# Patient Record
Sex: Female | Born: 1980 | Race: White | Hispanic: No | Marital: Married | Smoking: Former smoker
Health system: Southern US, Community
[De-identification: ages and names within clinical notes are randomized; demographics above are authoritative.]

## PROBLEM LIST (undated history)

## (undated) ENCOUNTER — Inpatient Hospital Stay (HOSPITAL_COMMUNITY): Payer: Self-pay

## (undated) DIAGNOSIS — K649 Unspecified hemorrhoids: Secondary | ICD-10-CM

## (undated) DIAGNOSIS — K6289 Other specified diseases of anus and rectum: Secondary | ICD-10-CM

## (undated) DIAGNOSIS — N816 Rectocele: Secondary | ICD-10-CM

## (undated) HISTORY — DX: Other specified diseases of anus and rectum: K62.89

---

## 2005-10-01 HISTORY — PX: BREAST SURGERY: SHX581

## 2006-05-27 ENCOUNTER — Other Ambulatory Visit: Admission: RE | Admit: 2006-05-27 | Discharge: 2006-05-27 | Payer: Self-pay | Admitting: Obstetrics and Gynecology

## 2007-05-15 ENCOUNTER — Encounter: Admission: RE | Admit: 2007-05-15 | Discharge: 2007-05-15 | Payer: Self-pay | Admitting: Family Medicine

## 2007-05-27 ENCOUNTER — Other Ambulatory Visit: Admission: RE | Admit: 2007-05-27 | Discharge: 2007-05-27 | Payer: Self-pay | Admitting: Obstetrics and Gynecology

## 2007-12-31 ENCOUNTER — Ambulatory Visit (HOSPITAL_COMMUNITY): Admission: RE | Admit: 2007-12-31 | Discharge: 2007-12-31 | Payer: Self-pay | Admitting: General Surgery

## 2008-01-30 HISTORY — PX: CHOLECYSTECTOMY: SHX55

## 2008-02-19 ENCOUNTER — Encounter (INDEPENDENT_AMBULATORY_CARE_PROVIDER_SITE_OTHER): Payer: Self-pay | Admitting: General Surgery

## 2008-02-19 ENCOUNTER — Ambulatory Visit (HOSPITAL_COMMUNITY): Admission: RE | Admit: 2008-02-19 | Discharge: 2008-02-19 | Payer: Self-pay | Admitting: General Surgery

## 2009-03-19 ENCOUNTER — Inpatient Hospital Stay (HOSPITAL_COMMUNITY): Admission: AD | Admit: 2009-03-19 | Discharge: 2009-03-22 | Payer: Self-pay | Admitting: Obstetrics and Gynecology

## 2009-03-20 ENCOUNTER — Encounter (INDEPENDENT_AMBULATORY_CARE_PROVIDER_SITE_OTHER): Payer: Self-pay | Admitting: Obstetrics and Gynecology

## 2009-12-25 IMAGING — NM NM HEPATO W/GB/PHARM/[PERSON_NAME]
3 series · 18 of 18 positions shown · non-contrast
Comparison: none

CLINICAL DATA: Abdominal pain

[hida · 2.40mm/px · 6 of 12 frames shown (1 of 3)]
[frame 2/12]
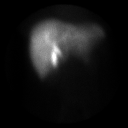
[frame 4/12]
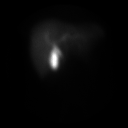
[frame 6/12]
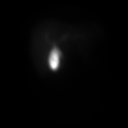
[frame 8/12]
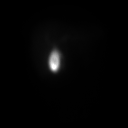
[frame 10/12]
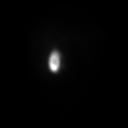
[frame 12/12]
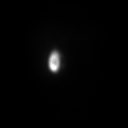

[hida · 2.40mm/px · 6 of 60 frames shown (2 of 3)]
[frame 6/60]
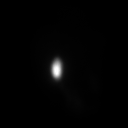
[frame 16/60]
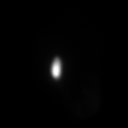
[frame 26/60]
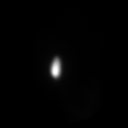
[frame 36/60]
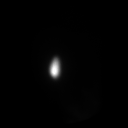
[frame 46/60]
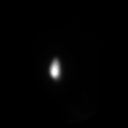
[frame 56/60]
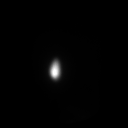

[hida · 2.40mm/px · 6 of 60 frames shown (3 of 3)]
[frame 6/60]
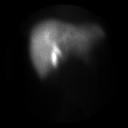
[frame 16/60]
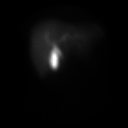
[frame 26/60]
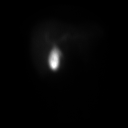
[frame 36/60]
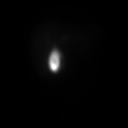
[frame 46/60]
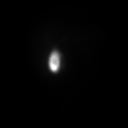
[frame 56/60]
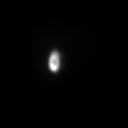

[18 of 18 positions shown; findings below may reference images not displayed]

HEPATOBILIARY SCINTIGRAPHY WITH EJECTION FRACTION]    :

Anterior imaging after J.DmMi McVV9 Choletec IV. There is prompt
clearance of the radiopharmaceutical from the blood pool. Timely
visualization of activity in central bile ducts, small bowel, and
gallbladder.
After 1 hour, the patient ingested 8 ounces half-and-half orally.
The calculated gallbladder ejection fraction after 60 minutes is 1%
(normally greater than 50%).

IMPRESSION
1. Patency of cystic and common bile ducts.
2. Significantly decreased gallbladder ejection fraction.

## 2010-10-18 ENCOUNTER — Inpatient Hospital Stay (HOSPITAL_COMMUNITY)
Admission: AD | Admit: 2010-10-18 | Discharge: 2010-10-18 | Payer: Self-pay | Source: Home / Self Care | Attending: Obstetrics and Gynecology | Admitting: Obstetrics and Gynecology

## 2010-10-22 ENCOUNTER — Encounter: Payer: Self-pay | Admitting: General Surgery

## 2010-10-23 LAB — URINALYSIS, ROUTINE W REFLEX MICROSCOPIC
Bilirubin Urine: NEGATIVE
Hgb urine dipstick: NEGATIVE
Ketones, ur: NEGATIVE mg/dL
Protein, ur: NEGATIVE mg/dL
Urine Glucose, Fasting: NEGATIVE mg/dL

## 2010-10-23 LAB — GC/CHLAMYDIA PROBE AMP, GENITAL
Chlamydia, DNA Probe: NEGATIVE
GC Probe Amp, Genital: NEGATIVE

## 2010-10-23 LAB — WET PREP, GENITAL

## 2010-12-29 ENCOUNTER — Inpatient Hospital Stay (HOSPITAL_COMMUNITY)
Admission: RE | Admit: 2010-12-29 | Discharge: 2010-12-30 | DRG: 775 | Disposition: A | Discharge status: Home / Self Care | Payer: PRIVATE HEALTH INSURANCE | Source: Ambulatory Visit | Attending: Obstetrics and Gynecology | Admitting: Obstetrics and Gynecology

## 2010-12-29 DIAGNOSIS — O99892 Other specified diseases and conditions complicating childbirth: Secondary | ICD-10-CM | POA: Diagnosis present

## 2010-12-29 DIAGNOSIS — Z22330 Carrier of Group B streptococcus: Secondary | ICD-10-CM

## 2010-12-29 LAB — CBC
MCH: 31.2 pg (ref 26.0–34.0)
Platelets: 209 10*3/uL (ref 150–400)
RBC: 3.94 MIL/uL (ref 3.87–5.11)
WBC: 8.4 10*3/uL (ref 4.0–10.5)

## 2010-12-29 LAB — RPR: RPR Ser Ql: NONREACTIVE

## 2010-12-30 LAB — CBC
Hemoglobin: 11.6 g/dL — ABNORMAL LOW (ref 12.0–15.0)
MCHC: 32.7 g/dL (ref 30.0–36.0)
RBC: 3.82 MIL/uL — ABNORMAL LOW (ref 3.87–5.11)
WBC: 14.1 10*3/uL — ABNORMAL HIGH (ref 4.0–10.5)

## 2011-01-01 ENCOUNTER — Encounter (HOSPITAL_COMMUNITY)
Admission: RE | Admit: 2011-01-01 | Discharge: 2011-01-01 | Disposition: A | Discharge status: Home / Self Care | Payer: PRIVATE HEALTH INSURANCE | Source: Ambulatory Visit | Attending: Obstetrics and Gynecology | Admitting: Obstetrics and Gynecology

## 2011-01-01 DIAGNOSIS — O923 Agalactia: Secondary | ICD-10-CM | POA: Insufficient documentation

## 2011-01-01 LAB — RH IMMUNE GLOB WKUP(>/=20WKS)(NOT WOMEN'S HOSP)
Antibody Screen: NEGATIVE
Fetal Screen: NEGATIVE
Unit division: 0

## 2011-01-08 LAB — CBC
HCT: 23.7 % — ABNORMAL LOW (ref 36.0–46.0)
HCT: 24.1 % — ABNORMAL LOW (ref 36.0–46.0)
Hemoglobin: 11.8 g/dL — ABNORMAL LOW (ref 12.0–15.0)
MCHC: 34.5 g/dL (ref 30.0–36.0)
MCV: 94 fL (ref 78.0–100.0)
MCV: 95.1 fL (ref 78.0–100.0)
MCV: 95.3 fL (ref 78.0–100.0)
Platelets: 173 10*3/uL (ref 150–400)
RBC: 2.54 MIL/uL — ABNORMAL LOW (ref 3.87–5.11)
RBC: 3.6 MIL/uL — ABNORMAL LOW (ref 3.87–5.11)
RDW: 13 % (ref 11.5–15.5)
WBC: 10.4 10*3/uL (ref 4.0–10.5)

## 2011-01-08 LAB — RPR: RPR Ser Ql: NONREACTIVE

## 2011-02-13 NOTE — Op Note (Signed)
[name], [name]             ACCOUNT NO.:  [id number]   MEDICAL RECORD NO.:  [id number]          PATIENT TYPE:  AMB   LOCATION:  SDS                          FACILITY:  [facility]   PHYSICIAN:  Ollen Gross. Vernell Morgans, M.D. DATE OF BIRTH:  Feb 02, 1981   DATE OF PROCEDURE:  02/19/2008  DATE OF DISCHARGE:  02/19/2008                               OPERATIVE REPORT   PREOPERATIVE DIAGNOSIS:  Biliary dyskinesia.   POSTOPERATIVE DIAGNOSIS:  Biliary dyskinesia.   PROCEDURE:  Laparoscopic cholecystectomy with intraoperative  cholangiogram.   SURGEON:  Ollen Gross. Vernell Morgans, MD   ASSISTANT:  Sandria Bales. Ezzard Standing, MD   ANESTHESIA:  General endotracheal.   PROCEDURE:  After informed consent was obtained, the patient was brought  to the operating room and placed in the supine position on the operating  room table.  After adequate induction of general anesthesia, the  patient's abdomen was prepped with Betadine and draped in usual sterile  manner.  The area above the umbilicus was infiltrated with 0.25%  Marcaine.  The patient requested that we excise her old belly ring scar.  This little area of scar was then excised sharply with an 11 blade  knife.  Dissection was carried to the subcutaneous tissue bluntly with a  hemostat and [employer] retractors until the linea alba was identified.  The linea alba was incised with a 15 blade knife and each side was  grasped with Kocher clamps and elevated anteriorly.  The preperitoneal  space was then probed bluntly with a hemostat until the peritoneum was  opened and access was gained to the abdominal cavity.  A 0 Vicryl  pursestring stitch was placed in the fascia around the opening.  A  Hasson cannula was placed through the opening and anchored in place to  previously placed 0 Vicryl pursestring stitch.  The abdomen was then  insufflated with carbon dioxide without difficulty.  The patient was  placed in reverse Trendelenburg position and rotated with the right  side  up.  A laparoscope was inserted through the Hasson cannula and the right  upper quadrant was inspected.  The dome of the gallbladder and liver  were readily identified.  Next, the epigastric region was then  infiltrated with 0.25% Marcaine.  A small incision was made with a 15  blade knife.  A 10-mm port was then placed bluntly through this incision  into the abdominal cavity under direct vision.  Sites were then chosen  laterally on the right side of the abdomen with placement of 5-mm ports.  Each of these areas were infiltrated with 0.25% Marcaine.  Small stab  incisions were made with a 15 blade knife.  The 5-mm ports were then  placed bluntly through these incisions into the abdominal cavity under  direct vision.  Blunt grasper was placed through the lateral-most 5-mm  port and used to grasp the dome of the gallbladder and elevated  anteriorly and superiorly.  Another blunt grasper was placed through the  other 5-mm port and used to retract on the body and neck of the  gallbladder.  A dissector was placed through  the epigastric port and  initially there were some filmy adhesions to the neck area of the  gallbladder.  These were taken down sharply with the laparoscopic  scissors.  Next, the dissector was placed through the epigastric port  using the electrocautery.  The peritoneal reflection at the gallbladder  neck area was opened.  Blunt dissection was then carried out in this  area until the gallbladder neck cystic duct junction was readily  identified and a good window was created.  A single clip was placed on  the gallbladder neck.  A small ductotomy was made just below the clip  with laparoscopic scissors.  A 14-gauge Angiocath was placed  percutaneously through the anterior abdominal wall under direct vision.  A Reddick cholangiogram catheter was placed through the Angiocath and  flushed.  The Reddick catheter was then placed within the cystic duct  and anchored in place  with a clip.  A cholangiogram was obtained that  showed no filling defects, good emptying in the duodenum and good  adequate length on the cystic duct.  The anchoring clip and catheters  were removed from the patient.  Three clips were placed proximally in  the cystic duct and the duct was divided between the two sets of clips.  Posterior to this, the cystic artery was identified and again dissected  bluntly in a circumferential manner until a good window was created.  Two clips were placed proximally and one distally in the artery and the  artery was divided between the two.  Next, a laparoscopic hook cautery  device was used to separate the gallbladder from the liver bed.  Prior  to completely detaching the gallbladder from the liver bed, the liver  bed was inspected and several small bleeding points were coagulated with  electrocautery until the area was completely hemostatic.  Gallbladder  was then detached the rest of the way from the liver bed without  difficulty with the cautery.  A laparoscopic bag was inserted through  the epigastric port.  The gallbladder was placed in the bag and the bag  was sealed.  Abdomen was then irrigated with copious amounts of saline  until the effluent was clear.  The laparoscope was then moved to the  epigastric port and a gallbladder grasper was placed through the Hasson  cannula and used to grasp the opening of the bag.  The bag with the  gallbladder was removed through the supraumbilical port without  difficulty.  The fascial defect was closed with the previously placed 0  Vicryl pursestring stitch, as well as with another figure-of-eight 0  Vicryl stitch.  The rest of the ports were removed under direct vision  and were found to be hemostatic.  The gas was allowed to escape.  The  fascia of the epigastric port was also closed with interrupted 0 Vicryl  stitch and the skin incisions were all closed with interrupted 4-0  Monocryl subcuticular  stitches and Dermabond dressings were applied.  The patient tolerated the procedure well.  At the end of the case, all  needle, sponge and instrument counts were correct.  The patient was then  awakened and taken to the recovery room in stable condition.      Ollen Gross. Vernell Morgans, M.D.  Electronically Signed     PST/MEDQ  D:  02/19/2008  T:  02/19/2008  Job:  [id number]

## 2011-06-27 LAB — COMPREHENSIVE METABOLIC PANEL
Albumin: 4.5
BUN: 15
Creatinine, Ser: 1.15
Total Bilirubin: 1.2
Total Protein: 6.9

## 2011-06-27 LAB — DIFFERENTIAL
Basophils Absolute: 0
Lymphocytes Relative: 40
Monocytes Absolute: 0.3
Monocytes Relative: 9
Neutro Abs: 2

## 2011-06-27 LAB — CBC
HCT: 41.8
MCV: 95
Platelets: 249
RDW: 12.5

## 2011-10-10 ENCOUNTER — Ambulatory Visit (INDEPENDENT_AMBULATORY_CARE_PROVIDER_SITE_OTHER): Payer: Self-pay | Admitting: General Surgery

## 2011-10-11 ENCOUNTER — Encounter (INDEPENDENT_AMBULATORY_CARE_PROVIDER_SITE_OTHER): Payer: Self-pay | Admitting: General Surgery

## 2011-10-12 ENCOUNTER — Encounter (INDEPENDENT_AMBULATORY_CARE_PROVIDER_SITE_OTHER): Payer: Self-pay | Admitting: General Surgery

## 2011-10-12 ENCOUNTER — Ambulatory Visit (INDEPENDENT_AMBULATORY_CARE_PROVIDER_SITE_OTHER): Payer: PRIVATE HEALTH INSURANCE | Admitting: General Surgery

## 2011-10-12 VITALS — BP 106/68 | HR 54 | Temp 98.3°F | Ht 70.0 in | Wt 143.8 lb

## 2011-10-12 DIAGNOSIS — K644 Residual hemorrhoidal skin tags: Secondary | ICD-10-CM

## 2011-10-12 NOTE — Patient Instructions (Signed)
Avoid constipation Drink plenty of fluids

## 2011-10-12 NOTE — Progress Notes (Signed)
Subjective:     Patient ID: [name], female   DOB: 08-Jan-1981, 31 y.o.   MRN: [id number]  HPI The patient is a 31 year old white female who I have seen in the past for a removal of her gallbladder. Since having her children she has had off and on discomfort in her rectum which she attributes to hemorrhoids. She denies any bleeding with her bowel movements. She denies any problems with constipation. She's not had any discharge from the area. She denies any fevers or chills. She states that she frequently needs to use baby wipes to get clean  Review of Systems  Constitutional: Negative.   HENT: Negative.   Eyes: Negative.   Respiratory: Negative.   Cardiovascular: Negative.   Gastrointestinal: Positive for rectal pain.  Genitourinary: Negative.   Musculoskeletal: Negative.   Skin: Negative.   Neurological: Negative.   Hematological: Negative.   Psychiatric/Behavioral: Negative.        Objective:   Physical Exam  Constitutional: She is oriented to person, place, and time. She appears well-developed and well-nourished.  HENT:  Head: Normocephalic and atraumatic.  Eyes: Conjunctivae and EOM are normal. Pupils are equal, round, and reactive to light.  Neck: Normal range of motion. Neck supple.  Cardiovascular: Normal rate, regular rhythm and normal heart sounds.   Pulmonary/Chest: Effort normal and breath sounds normal.  Abdominal: Soft. Bowel sounds are normal.  Genitourinary:       On rectal exam her perirectal skin looks good. Posteriorly she has one small soft benign appearing hemorrhoidal skin tag. On digital exam she has no palpable mass. On anoscopic exam she has no significant internal hemorrhoidal tissue.  Musculoskeletal: Normal range of motion.  Neurological: She is alert and oriented to person, place, and time.  Skin: Skin is warm and dry.  Psychiatric: She has a normal mood and affect. Her behavior is normal.       Assessment:     External hemorrhoidal skin  tag    Plan:     At this point I believe this external hemorrhoidal skin tag is probably small enough that I would not recommend having it removed. I believe it is good to continue using baby wipes to get clean as this will be healthier for her skin. I recommended continuing to stay well hydrated and avoid periods of constipation. She will consult with her family and if she decides to have a skin tag removed we can certainly do this for her. I have discussed with her in detail the risks and benefits of the operation to this as well as some of the technical aspects and she understands and will call us

## 2013-05-11 ENCOUNTER — Ambulatory Visit (INDEPENDENT_AMBULATORY_CARE_PROVIDER_SITE_OTHER): Payer: PRIVATE HEALTH INSURANCE | Admitting: Surgery

## 2013-05-11 ENCOUNTER — Encounter (INDEPENDENT_AMBULATORY_CARE_PROVIDER_SITE_OTHER): Payer: Self-pay | Admitting: Surgery

## 2013-05-11 VITALS — BP 104/70 | HR 84 | Resp 14 | Ht 70.0 in | Wt 129.6 lb

## 2013-05-11 DIAGNOSIS — K644 Residual hemorrhoidal skin tags: Secondary | ICD-10-CM

## 2013-05-11 NOTE — Progress Notes (Signed)
[facility]  Ovidio Kin, MD,  FACS [address] [place]  [address] [place], [place]    [address] Phone:  [phone] FAX:  [phone]   Re:   Uriah Trueba DOB:   April 18, 1981 MRN:   [id number]  Urgent Office  ASSESSMENT AND PLAN: 1.  Hemorrhoid, thrombosed - left lateral  This is already shrinking and healing.  We decided to do nothing further at this time.  I gave her literature on hemorrhoids.  She is already doing a lot of things correct for her hemorrhoidal disease.  I encouraged her to do sitz baths.  Return appt to [place] is PRN.  HISTORY OF PRESENT ILLNESS: No chief complaint on file.  [name] is a 32 y.o. (DOB: 1981-09-14)  white  female who is a patient of [facility] and comes to me the Urgent Office for hemorrhoids.  The patient blames her pregnancies for hemorrhoids. She's had a prior cholecystectomy by Dr. Carolynne Edouard.  He saw Dr. Carolynne Edouard in January 2013 for hemorrhoidal skin tag. Last Thursday, 05/07/2013 she developed perianal pain and felt a knot around her anus.  She saw Dr. Langston Masker, at [facility].  She says that she gets hemorrhoids from exercise and gravity.  The knot has been painful, though a little better than last week.  She has no history of colon or rectal disease.  Social History: Married. Does not work. Has 2 children - 2 and 4 yo.  PHYSICAL EXAM: BP 104/70  Pulse 84  Resp 14  Ht 5\' 10"  (1.778 m)  Wt 129 lb 9.6 oz (58.786 kg)  BMI 18.6 kg/m2  General:  Thin WN WF in no acute distress. Rectum:  1.5 cm thrombosed hemorrhoid on the left side of the rectum.  But the tissue over the hemorrhoid is soft and it looks like the hemorrhoid is already healing.  No mass on rectal exam.  DATA REVIEWED: None.  Ovidio Kin, MD,  [facility] [address] [place]  [address]   [place], [place]    [address] Phone:  [phone] FAX:  [phone]

## 2013-11-25 ENCOUNTER — Other Ambulatory Visit (INDEPENDENT_AMBULATORY_CARE_PROVIDER_SITE_OTHER): Payer: PRIVATE HEALTH INSURANCE

## 2013-11-25 DIAGNOSIS — Z3200 Encounter for pregnancy test, result unknown: Secondary | ICD-10-CM

## 2013-11-25 LAB — PROGESTERONE: Progesterone: 28.6 ng/mL

## 2013-11-25 LAB — HCG, QUANTITATIVE, PREGNANCY: HCG, BETA CHAIN, QUANT, S: 39442 m[IU]/mL

## 2013-12-04 ENCOUNTER — Inpatient Hospital Stay (HOSPITAL_COMMUNITY): Payer: PRIVATE HEALTH INSURANCE

## 2013-12-04 ENCOUNTER — Encounter (HOSPITAL_COMMUNITY): Payer: Self-pay | Admitting: *Deleted

## 2013-12-04 ENCOUNTER — Inpatient Hospital Stay (HOSPITAL_COMMUNITY)
Admission: AD | Admit: 2013-12-04 | Discharge: 2013-12-04 | Disposition: A | Discharge status: Home / Self Care | Payer: PRIVATE HEALTH INSURANCE | Source: Ambulatory Visit | Attending: Obstetrics and Gynecology | Admitting: Obstetrics and Gynecology

## 2013-12-04 DIAGNOSIS — O9989 Other specified diseases and conditions complicating pregnancy, childbirth and the puerperium: Secondary | ICD-10-CM

## 2013-12-04 DIAGNOSIS — R102 Pelvic and perineal pain: Secondary | ICD-10-CM

## 2013-12-04 DIAGNOSIS — N949 Unspecified condition associated with female genital organs and menstrual cycle: Secondary | ICD-10-CM | POA: Insufficient documentation

## 2013-12-04 DIAGNOSIS — O99891 Other specified diseases and conditions complicating pregnancy: Secondary | ICD-10-CM | POA: Insufficient documentation

## 2013-12-04 DIAGNOSIS — R109 Unspecified abdominal pain: Secondary | ICD-10-CM | POA: Insufficient documentation

## 2013-12-04 DIAGNOSIS — Z87891 Personal history of nicotine dependence: Secondary | ICD-10-CM | POA: Insufficient documentation

## 2013-12-04 DIAGNOSIS — O26891 Other specified pregnancy related conditions, first trimester: Secondary | ICD-10-CM

## 2013-12-04 LAB — URINALYSIS, ROUTINE W REFLEX MICROSCOPIC
BILIRUBIN URINE: NEGATIVE
GLUCOSE, UA: NEGATIVE mg/dL
HGB URINE DIPSTICK: NEGATIVE
Ketones, ur: NEGATIVE mg/dL
Leukocytes, UA: NEGATIVE
Nitrite: NEGATIVE
PROTEIN: NEGATIVE mg/dL
Urobilinogen, UA: 0.2 mg/dL (ref 0.0–1.0)
pH: 6 (ref 5.0–8.0)

## 2013-12-04 LAB — ABO/RH: ABO/RH(D): O NEG

## 2013-12-04 LAB — HCG, QUANTITATIVE, PREGNANCY: hCG, Beta Chain, Quant, S: 128091 m[IU]/mL — ABNORMAL HIGH (ref <5)

## 2013-12-04 NOTE — MAU Note (Signed)
PT SAYS SHE  STARTED CRAMPING LAST FR-  THEN BECAME WORSE ON SAT AFTER SEX-  THEN SHE HAD SPOTTING ALSO- THROUGH Monday.   Tuesday -  TODAY-  HAS BEEN CRAMPING  BUT TODAY WORSE.    WAS SEEN IN  [facility] OFFICE  ON 2-18     DID NOT CALL  DR.    NEXT APPOINTMENT  IS  3-12.

## 2013-12-04 NOTE — MAU Provider Note (Signed)
History     CSN: [id number]  Arrival date and time: 12/04/13 [id number]   First Provider Initiated Contact with Patient 12/04/13 2047      Chief Complaint  Patient presents with  . Abdominal Cramping   Abdominal Cramping    [name] is a 33 y.o. [id number] at 5755w1d who presents today with cramping. She states that she has had cramping for about a week, but is concerned because she has had two miscarriages. She denies any bleeding at this time. [facility], and has had HCG levels done there. She was told that they were rising as they should.   Past Medical History  Diagnosis Date  . Rectal pain     Past Surgical History  Procedure Laterality Date  . Cholecystectomy  01/2008  . Breast surgery  10/2005    breast augmentation    History reviewed. No pertinent family history.  History  Substance Use Topics  . Smoking status: Former Games developermoker  . Smokeless tobacco: Not on file  . Alcohol Use: 0.0 oz/week    2-4 Glasses of wine per week     Comment: NONE WHILE PREG    Allergies:  Allergies  Allergen Reactions  . Amoxicillin Rash    Prescriptions prior to admission  Medication Sig Dispense Refill  . folic acid (FOLVITE) 1 MG tablet Take 1 mg by mouth daily.      . Multiple Vitamin (MULTIVITAMIN) capsule Take 1 capsule by mouth daily.      Marland Kitchen. etonogestrel-ethinyl estradiol (NUVARING) 0.12-0.015 MG/24HR vaginal ring Place 1 each vaginally every 28 (twenty-eight) days. Insert vaginally and leave in place for 3 consecutive weeks, then remove for 1 week.      Marland Kitchen. FIBER PO Take by mouth daily.      . psyllium (METAMUCIL) 58.6 % powder Take 1 packet by mouth 3 (three) times daily.        ROS Physical Exam   Blood pressure 109/65, pulse 67, temperature 98.8 F (37.1 C), resp. rate 20, height 5\' 10"  (1.778 m), weight 62.415 kg (137 lb 9.6 oz), last menstrual period 10/15/2013.  Physical Exam  Nursing note and vitals reviewed. Constitutional: She is  oriented to person, place, and time. She appears well-developed and well-nourished. No distress.  Cardiovascular: Normal rate.   Respiratory: Effort normal.  GI: Soft. There is no tenderness.  Neurological: She is alert and oriented to person, place, and time.  Skin: Skin is warm and dry.  Psychiatric: She has a normal mood and affect.    MAU Course  Procedures  Results for orders placed during the hospital encounter of 12/04/13 (from the past 24 hour(s))  HCG, QUANTITATIVE, PREGNANCY     Status: Abnormal   Collection Time    12/04/13  7:45 PM      Result Value Ref Range   hCG, Beta Francene FindersChain, Quant, S [id number] (*) <5 mIU/mL  ABO/RH     Status: None   Collection Time    12/04/13  7:45 PM      Result Value Ref Range   ABO/RH(D) O NEG     [place] Ob Comp Less 14 Wks  12/04/2013   CLINICAL DATA:  Cramping.  EXAM: OBSTETRIC <14 WK ULTRASOUND  TECHNIQUE: Transabdominal ultrasound was performed for evaluation of the gestation as well as the maternal uterus and adnexal regions.  COMPARISON:  No priors.  FINDINGS: Intrauterine gestational sac: Present.  Yolk sac:  Present.  Embryo:  Present.  Cardiac Activity:  Present.  Heart Rate: 119 bpm  CRL:   5.4  mm   6 w 3 d                  [place] EDC: 07/27/2014  Maternal uterus/adnexae: No evidence of subchorionic hemorrhage. Right ovary is unremarkable in appearance. Left ovary is remarkable for a probable degenerating corpus luteum cyst. No significant free fluid in the cul-de-sac.  IMPRESSION: 1. Single viable IUP with estimated gestational age of approximately 6 weeks and 3 days, and fetal heart rate of 119 beats per min. 2. No acute findings.   Electronically Signed   By: Trudie Reed M.D.   On: 12/04/2013 21:08    Assessment and Plan   1. Pregnancy related pelvic pain in first trimester, antepartum    First trimester danger signs reviewed Return to MAU as needed  Follow-up Information   Follow up with LOWE,DAVID C, MD. (As scheduled)    Specialty:   Obstetrics and Gynecology   Contact information:   [address] [place], [address] [place] [address] [phone]        Tawnya Crook 12/04/2013, 8:52 PM

## 2013-12-04 NOTE — MAU Note (Signed)
I've had cramping off and on for a week. Had 2 miscarriages in the last yr so i'm scared. Spotting last wkend after intercourse but has stopped and no bleeding now.

## 2013-12-04 NOTE — Discharge Instructions (Signed)
Pregnancy - First Trimester  During sexual intercourse, millions of sperm go into the vagina. Only 1 sperm will penetrate and fertilize the female egg while it is in the Fallopian tube. One week later, the fertilized egg implants into the wall of the uterus. An embryo begins to develop into a baby. At 6 to 8 weeks, the eyes and face are formed and the heartbeat can be seen on ultrasound. At the end of 12 weeks (first trimester), all the baby's organs are formed. Now that you are pregnant, you will want to do everything you can to have a healthy baby. Two of the most important things are to get good prenatal care and follow your caregiver's instructions. Prenatal care is all the medical care you receive before the baby's birth. It is given to prevent, find, and treat problems during the pregnancy and childbirth.  PRENATAL EXAMS  · During prenatal visits, your weight, blood pressure, and urine are checked. This is done to make sure you are healthy and progressing normally during the pregnancy.  · A pregnant woman should gain 25 to 35 pounds during the pregnancy. However, if you are overweight or underweight, your caregiver will advise you regarding your weight.  · Your caregiver will ask and answer questions for you.  · Blood work, cervical cultures, other necessary tests, and a Pap test are done during your prenatal exams. These tests are done to check on your health and the probable health of your baby. Tests are strongly recommended and done for HIV with your permission. This is the virus that causes AIDS. These tests are done because medicines can be given to help prevent your baby from being born with this infection should you have been infected without knowing it. Blood work is also used to find out your blood type, previous infections, and follow your blood levels (hemoglobin).  · Low hemoglobin (anemia) is common during pregnancy. Iron and vitamins are given to help prevent this. Later in the pregnancy, blood  tests for diabetes will be done along with any other tests if any problems develop.  · You may need other tests to make sure you and the baby are doing well.  CHANGES DURING THE FIRST TRIMESTER   Your body goes through many changes during pregnancy. They vary from person to person. Talk to your caregiver about changes you notice and are concerned about. Changes can include:  · Your menstrual period stops.  · The egg and sperm carry the genes that determine what you look like. Genes from you and your partner are forming a baby. The female genes determine whether the baby is a boy or a girl.  · Your body increases in girth and you may feel bloated.  · Feeling sick to your stomach (nauseous) and throwing up (vomiting). If the vomiting is uncontrollable, call your caregiver.  · Your breasts will begin to enlarge and become tender.  · Your nipples may stick out more and become darker.  · The need to urinate more. Painful urination may mean you have a bladder infection.  · Tiring easily.  · Loss of appetite.  · Cravings for certain kinds of food.  · At first, you may gain or lose a couple of pounds.  · You may have changes in your emotions from day to day (excited to be pregnant or concerned something may go wrong with the pregnancy and baby).  · You may have more vivid and strange dreams.  HOME CARE INSTRUCTIONS   ·   It is very important to avoid all smoking, alcohol and non-prescribed drugs during your pregnancy. These affect the formation and growth of the baby. Avoid chemicals while pregnant to ensure the delivery of a healthy infant.  · Start your prenatal visits by the 12th week of pregnancy. They are usually scheduled monthly at first, then more often in the last 2 months before delivery. Keep your caregiver's appointments. Follow your caregiver's instructions regarding medicine use, blood and lab tests, exercise, and diet.  · During pregnancy, you are providing food for you and your baby. Eat regular, well-balanced  meals. Choose foods such as meat, fish, milk and other low fat dairy products, vegetables, fruits, and whole-grain breads and cereals. Your caregiver will tell you of the ideal weight gain.  · You can help morning sickness by keeping soda crackers at the bedside. Eat a couple before arising in the morning. You may want to use the crackers without salt on them.  · Eating 4 to 5 small meals rather than 3 large meals a day also may help the nausea and vomiting.  · Drinking liquids between meals instead of during meals also seems to help nausea and vomiting.  · A physical sexual relationship may be continued throughout pregnancy if there are no other problems. Problems may be early (premature) leaking of amniotic fluid from the membranes, vaginal bleeding, or belly (abdominal) pain.  · Exercise regularly if there are no restrictions. Check with your caregiver or physical therapist if you are unsure of the safety of some of your exercises. Greater weight gain will occur in the last 2 trimesters of pregnancy. Exercising will help:  · Control your weight.  · Keep you in shape.  · Prepare you for labor and delivery.  · Help you lose your pregnancy weight after you deliver your baby.  · Wear a good support or jogging bra for breast tenderness during pregnancy. This may help if worn during sleep too.  · Ask when prenatal classes are available. Begin classes when they are offered.  · Do not use hot tubs, steam rooms, or saunas.  · Wear your seat belt when driving. This protects you and your baby if you are in an accident.  · Avoid raw meat, uncooked cheese, cat litter boxes, and soil used by cats throughout the pregnancy. These carry germs that can cause birth defects in the baby.  · The first trimester is a good time to visit your dentist for your dental health. Getting your teeth cleaned is okay. Use a softer toothbrush and brush gently during pregnancy.  · Ask for help if you have financial, counseling, or nutritional needs  during pregnancy. Your caregiver will be able to offer counseling for these needs as well as refer you for other special needs.  · Do not take any medicines or herbs unless told by your caregiver.  · Inform your caregiver if there is any mental or physical domestic violence.  · Make a list of emergency phone numbers of family, friends, hospital, and police and fire departments.  · Write down your questions. Take them to your prenatal visit.  · Do not douche.  · Do not cross your legs.  · If you have to stand for long periods of time, rotate you feet or take small steps in a circle.  · You may have more vaginal secretions that may require a sanitary pad. Do not use tampons or scented sanitary pads.  MEDICINES AND DRUG USE IN PREGNANCY  ·   Take prenatal vitamins as directed. The vitamin should contain 1 milligram of folic acid. Keep all vitamins out of reach of children. Only a couple vitamins or tablets containing iron may be fatal to a baby or young child when ingested.  · Avoid use of all medicines, including herbs, over-the-counter medicines, not prescribed or suggested by your caregiver. Only take over-the-counter or prescription medicines for pain, discomfort, or fever as directed by your caregiver. Do not use aspirin, ibuprofen, or naproxen unless directed by your caregiver.  · Let your caregiver also know about herbs you may be using.  · Alcohol is related to a number of birth defects. This includes fetal alcohol syndrome. All alcohol, in any form, should be avoided completely. Smoking will cause low birth rate and premature babies.  · Street or illegal drugs are very harmful to the baby. They are absolutely forbidden. A baby born to an addicted mother will be addicted at birth. The baby will go through the same withdrawal an adult does.  · Let your caregiver know about any medicines that you have to take and for what reason you take them.  SEEK MEDICAL CARE IF:   You have any concerns or worries during your  pregnancy. It is better to call with your questions if you feel they cannot wait, rather than worry about them.  SEEK IMMEDIATE MEDICAL CARE IF:   · An unexplained oral temperature above 102° F (38.9° C) develops, or as your caregiver suggests.  · You have leaking of fluid from the vagina (birth canal). If leaking membranes are suspected, take your temperature and inform your caregiver of this when you call.  · There is vaginal spotting or bleeding. Notify your caregiver of the amount and how many pads are used.  · You develop a bad smelling vaginal discharge with a change in the color.  · You continue to feel sick to your stomach (nauseated) and have no relief from remedies suggested. You vomit blood or coffee ground-like materials.  · You lose more than 2 pounds of weight in 1 week.  · You gain more than 2 pounds of weight in 1 week and you notice swelling of your face, hands, feet, or legs.  · You gain 5 pounds or more in 1 week (even if you do not have swelling of your hands, face, legs, or feet).  · You get exposed to German measles and have never had them.  · You are exposed to fifth disease or chickenpox.  · You develop belly (abdominal) pain. Round ligament discomfort is a common non-cancerous (benign) cause of abdominal pain in pregnancy. Your caregiver still must evaluate this.  · You develop headache, fever, diarrhea, pain with urination, or shortness of breath.  · You fall or are in a car accident or have any kind of trauma.  · There is mental or physical violence in your home.  Document Released: 09/11/2001 Document Revised: 06/11/2012 Document Reviewed: 03/15/2009  Exit[employer]® Patient Information ©2014 [employer].

## 2013-12-14 ENCOUNTER — Encounter: Payer: PRIVATE HEALTH INSURANCE | Admitting: Advanced Practice Midwife

## 2013-12-29 LAB — OB RESULTS CONSOLE HIV ANTIBODY (ROUTINE TESTING): HIV: NONREACTIVE

## 2013-12-29 LAB — OB RESULTS CONSOLE RUBELLA ANTIBODY, IGM: RUBELLA: IMMUNE

## 2013-12-29 LAB — OB RESULTS CONSOLE HEPATITIS B SURFACE ANTIGEN: Hepatitis B Surface Ag: NEGATIVE

## 2013-12-29 LAB — OB RESULTS CONSOLE RPR: RPR: NONREACTIVE

## 2013-12-29 LAB — OB RESULTS CONSOLE ANTIBODY SCREEN: Antibody Screen: NEGATIVE

## 2014-07-18 ENCOUNTER — Inpatient Hospital Stay (HOSPITAL_COMMUNITY)
Admission: AD | Admit: 2014-07-18 | Discharge: 2014-07-18 | Disposition: A | Discharge status: Home / Self Care | Payer: PRIVATE HEALTH INSURANCE | Source: Ambulatory Visit | Attending: Obstetrics and Gynecology | Admitting: Obstetrics and Gynecology

## 2014-07-18 ENCOUNTER — Encounter (HOSPITAL_COMMUNITY): Payer: Self-pay | Admitting: *Deleted

## 2014-07-18 DIAGNOSIS — Z87891 Personal history of nicotine dependence: Secondary | ICD-10-CM | POA: Diagnosis not present

## 2014-07-18 DIAGNOSIS — O471 False labor at or after 37 completed weeks of gestation: Secondary | ICD-10-CM | POA: Insufficient documentation

## 2014-07-18 DIAGNOSIS — Z9882 Breast implant status: Secondary | ICD-10-CM

## 2014-07-18 DIAGNOSIS — Z6791 Unspecified blood type, Rh negative: Secondary | ICD-10-CM

## 2014-07-18 DIAGNOSIS — O98313 Other infections with a predominantly sexual mode of transmission complicating pregnancy, third trimester: Secondary | ICD-10-CM | POA: Insufficient documentation

## 2014-07-18 DIAGNOSIS — Z880 Allergy status to penicillin: Secondary | ICD-10-CM

## 2014-07-18 DIAGNOSIS — B009 Herpesviral infection, unspecified: Secondary | ICD-10-CM

## 2014-07-18 DIAGNOSIS — O3483 Maternal care for other abnormalities of pelvic organs, third trimester: Secondary | ICD-10-CM | POA: Insufficient documentation

## 2014-07-18 DIAGNOSIS — O98519 Other viral diseases complicating pregnancy, unspecified trimester: Secondary | ICD-10-CM

## 2014-07-18 DIAGNOSIS — O26899 Other specified pregnancy related conditions, unspecified trimester: Secondary | ICD-10-CM

## 2014-07-18 DIAGNOSIS — O360930 Maternal care for other rhesus isoimmunization, third trimester, not applicable or unspecified: Secondary | ICD-10-CM | POA: Diagnosis not present

## 2014-07-18 DIAGNOSIS — B951 Streptococcus, group B, as the cause of diseases classified elsewhere: Secondary | ICD-10-CM

## 2014-07-18 DIAGNOSIS — O3480 Maternal care for other abnormalities of pelvic organs, unspecified trimester: Secondary | ICD-10-CM

## 2014-07-18 DIAGNOSIS — N816 Rectocele: Secondary | ICD-10-CM | POA: Diagnosis not present

## 2014-07-18 DIAGNOSIS — O479 False labor, unspecified: Secondary | ICD-10-CM

## 2014-07-18 DIAGNOSIS — Z3A39 39 weeks gestation of pregnancy: Secondary | ICD-10-CM | POA: Diagnosis not present

## 2014-07-18 DIAGNOSIS — A6000 Herpesviral infection of urogenital system, unspecified: Secondary | ICD-10-CM | POA: Diagnosis not present

## 2014-07-18 HISTORY — DX: Rectocele: N81.6

## 2014-07-18 HISTORY — DX: Unspecified hemorrhoids: K64.9

## 2014-07-18 NOTE — Progress Notes (Signed)
[name] CNM 

## 2014-07-18 NOTE — MAU Note (Addendum)
Contractions since 0130. Denies leaking fld or bleeding. Has had 2 vag deliveries but wants to discuss c/s with MD. Had perineal problems and hemorrhoids from last deliveries. Also hx HSV and just wants to be sure baby is ok. Thinks may being having HSV outbreak now.

## 2014-07-18 NOTE — Progress Notes (Signed)
Sherre ScarletKimberly Williams CNM notified of pt's admission and status. Aware of ctx pattern, reactive FHR, pt's concerns with difficult last 2 vag deliveries, poss. Wanting a c/s, poss HSV outbreak. CNM will see pt

## 2014-07-19 ENCOUNTER — Encounter (HOSPITAL_COMMUNITY): Payer: Self-pay | Admitting: *Deleted

## 2014-07-19 NOTE — H&P (Addendum)
[name] is a 33 y.o. female, G5 P2002 at 39.5 weeks, Primary elective CS  Patient Active Problem List   Diagnosis Date Noted  . External hemorrhoid 10/12/2011    Pregnancy Course: Patient entered care at 26.6 weeks.   EDC of 07/22/14 was established by EDD.      US evaluations:   8.0 weeks - Dating:  CRL 7.6 weeks, FHR 154, yolk sac seen  11.6 weeks - FU: cervical length 5.5, FHR 179, nasal bone seen  18.6 weeks - Anatomy: EFW 9oz, cervical length 3.9, FHR 150, vertex, anterior placenta, 3 vessel cord,   Significant prenatal events: O neg, GBS Positive, Rectocele, HSV+, Pt refused Rhogam in this and previous pregnancy, Hx of macrocosmic infant 8lb 15oz, cholecystectomy 2009, breast implants 2007, clomid with 1st pregnancy  Last evaluation:   39.5 weeks    Reason for admission:  Primary elective   Pt States:   Contractions Frequency: none         Contraction severity: n/a         Fetal activity: +FM  OB History   Grav Para Term Preterm Abortions TAB SAB Ect Mult Living   5 2 2  2  2   2      Past Medical History  Diagnosis Date  . Rectal pain   . Rectocele   . Hemorrhoids    Past Surgical History  Procedure Laterality Date  . Cholecystectomy  01/2008  . Breast surgery  10/2005    breast augmentation   Family History: family history includes Hypertension in her mother; Mitral valve prolapse in her mother. Social History:  reports that she has quit smoking. She does not have any smokeless tobacco history on file. She reports that she drinks alcohol. She reports that she does not use illicit drugs.   Prenatal Transfer Tool  Maternal Diabetes: No Genetic Screening: Normal Maternal Ultrasounds/Referrals: Normal Fetal Ultrasounds or other Referrals:  None Maternal Substance Abuse:  No Significant Maternal Medications:  Meds include: Other:  valtrex, fluarix Significant Maternal Lab Results: Lab values include: Group B Strep positive, Rh negative   ROS:  See HPI  above, all other systems are negative  Allergies  Allergen Reactions  . Amoxicillin Rash     Last menstrual period 10/15/2013.  Maternal Exam:  Uterine Assessment: Contraction frequency is rare.  Abdomen: Gravid, non tender. Fundal height is aga.  Normal external genitalia, vulva, cervix, uterus and adnexa.  No lesions noted on exam.  Pelvis adequate for delivery.  Fetal presentation: Vertex by Leopold's  Fetal Exam:  Monitor Surveillance: Continuous Monitoring Mode: Ultrasound.  NICHD: Category CTXs: none EFW   7.5lbs  Physical Exam: Nursing note and vitals reviewed General: alert and cooperative She appears well nourished Psychiatric: Normal mood and affect. Her behavior is normal Head: Normocephalic Eyes: Pupils are equal, round, and reactive to light Neck: Normal range of motion Cardiovascular: RRR without murmur  Respiratory: CTAB. Effort normal  Abd: soft, non-tender, +BS, no rebound, no guarding  Genitourinary: Vagina normal  Neurological: A&Ox3 Skin: Warm and dry  Musculoskeletal: Normal range of motion  Homan's sign negative bilaterally No evidence of DVTs.  Edema: Minimal bilaterally non-pitting edema DTR: 2+ Clonus: None   Prenatal labs: ABO, Rh: --/--/O NEG (03/06 1945) Antibody: Negative (03/31 0000) Rubella:   immune RPR: Nonreactive (03/31 0000)  HBsAg: Negative (03/31 0000)  HIV: Non-reactive (03/31 0000)  GBS:  positive 9/13 Sickle cell/Hgb electrophoresis:  WNL Pap:  ABD pap and colpo in 2009, wnl  in 11/2013 GC:   negative Chlamydia: negative Genetic screenings:  wnl Glucola:  wnl  Assessment:  IUP at 39.5 weeks NICHD: Category Membranes: intact GBS positive Diagnosis: term IUP  Plan:  Admit to BS for schedule primary CS requested by patient because of history of HSV without active outbreak. Procedure, R&B thoroughly discussed at visit with dr Normand Sloopillard yesterday. R&B of CS reviewed with patient and family.  Pt and family verbalize  understanding of the procedure and agree with treatment plan.  Possibility of needing a blood transfusion reviewed.   Pt will accept blood products.   Routine Pre-OP orders   Venus Standard, CNM, MSN 07/19/2014, 12:49 PM       All information will be confirmed upon admisson

## 2014-07-19 NOTE — MAU Provider Note (Signed)
History  33 yo [id number] @ 39.4 wks presents to MAU unannounced w/ c/o regular ctxs since 0130. Reports active fetus and possible HSV outbreak. On Valtrex suppression since 34 wks. Denies LOF or VB.   Patient Active Problem List   Diagnosis Date Noted  . Irregular uterine contractions 07/18/2014  . Herpes simplex type 2 (HSV-2) infection affecting pregnancy, antepartum 07/18/2014  . Rh negative, maternal 07/18/2014  . Group beta Strep positive 07/18/2014  . Rectocele affecting pregnancy, antepartum 07/18/2014  . Herpes simplex type 1 infection 07/18/2014  . Allergy to amoxicillin 07/18/2014  . H/O breast augmentation 07/18/2014  . External hemorrhoid 10/12/2011  H/O LGA infant (8lbs 15 oz)  Chief Complaint  Patient presents with  . Contractions   HPI As above OB History   Grav Para Term Preterm Abortions TAB SAB Ect Mult Living   5 2 2  2  2   2       Past Medical History  Diagnosis Date  . Rectal pain   . Rectocele   . Hemorrhoids     Past Surgical History  Procedure Laterality Date  . Cholecystectomy  01/2008  . Breast surgery  10/2005    breast augmentation    Family History  Problem Relation Age of Onset  . Hypertension Mother   . Mitral valve prolapse Mother     History  Substance Use Topics  . Smoking status: Former Games developermoker  . Smokeless tobacco: Not on file  . Alcohol Use: 0.0 oz/week    2-4 Glasses of wine per week     Comment: NONE WHILE PREG    Allergies:  Allergies  Allergen Reactions  . Amoxicillin Rash    No prescriptions prior to admission    ROS Contractions +FM Possible herpes outbreak Physical Exam   Blood pressure 110/72, pulse 85, temperature 98.2 F (36.8 C), resp. rate 18, height 5\' 10"  (1.778 m), weight 165 lb 9.6 oz (75.116 kg), last menstrual period 10/15/2013.  Physical Exam Gen: Anxious Pelvic: NO LESIONS NOTED ON EXTERNAL VULVA, VAGINA, OR CERVIX Cervix: 1/thick/posterior/ballotable FHRT: BL 140 w/ mod variability,  +accels, no decels Ctxs: Irregular, mild ED Course  Assessment: Cat 1 FHRT Not in labor HSV 2; no evidence of outbreak(s)  Plan: Strict labor precautions Routine OB f/u tomorrow   Sherre ScarletWILLIAMS, Jailyn Langhorst CNM, MS 07/18/14, 06:30 AM

## 2014-07-20 ENCOUNTER — Other Ambulatory Visit: Payer: Self-pay | Admitting: Obstetrics and Gynecology

## 2014-07-20 ENCOUNTER — Encounter (HOSPITAL_COMMUNITY): Payer: Self-pay | Admitting: Anesthesiology

## 2014-07-20 ENCOUNTER — Inpatient Hospital Stay (HOSPITAL_COMMUNITY): Payer: PRIVATE HEALTH INSURANCE | Admitting: Anesthesiology

## 2014-07-20 ENCOUNTER — Encounter (HOSPITAL_COMMUNITY): Payer: PRIVATE HEALTH INSURANCE | Admitting: Anesthesiology

## 2014-07-20 ENCOUNTER — Encounter (HOSPITAL_COMMUNITY)
Admission: RE | Disposition: A | Discharge status: Home / Self Care | Payer: Self-pay | Source: Ambulatory Visit | Attending: Obstetrics and Gynecology

## 2014-07-20 ENCOUNTER — Inpatient Hospital Stay (HOSPITAL_COMMUNITY)
Admission: RE | Admit: 2014-07-20 | Discharge: 2014-07-22 | DRG: 765 | Disposition: A | Discharge status: Home / Self Care | Payer: PRIVATE HEALTH INSURANCE | Source: Ambulatory Visit | Attending: Obstetrics and Gynecology | Admitting: Obstetrics and Gynecology

## 2014-07-20 DIAGNOSIS — D649 Anemia, unspecified: Secondary | ICD-10-CM | POA: Diagnosis present

## 2014-07-20 DIAGNOSIS — O3483 Maternal care for other abnormalities of pelvic organs, third trimester: Secondary | ICD-10-CM | POA: Diagnosis present

## 2014-07-20 DIAGNOSIS — N816 Rectocele: Secondary | ICD-10-CM | POA: Diagnosis present

## 2014-07-20 DIAGNOSIS — Z8249 Family history of ischemic heart disease and other diseases of the circulatory system: Secondary | ICD-10-CM | POA: Diagnosis not present

## 2014-07-20 DIAGNOSIS — O9989 Other specified diseases and conditions complicating pregnancy, childbirth and the puerperium: Secondary | ICD-10-CM | POA: Diagnosis present

## 2014-07-20 DIAGNOSIS — O360930 Maternal care for other rhesus isoimmunization, third trimester, not applicable or unspecified: Secondary | ICD-10-CM | POA: Diagnosis present

## 2014-07-20 DIAGNOSIS — K219 Gastro-esophageal reflux disease without esophagitis: Secondary | ICD-10-CM | POA: Diagnosis present

## 2014-07-20 DIAGNOSIS — O2243 Hemorrhoids in pregnancy, third trimester: Secondary | ICD-10-CM | POA: Diagnosis present

## 2014-07-20 DIAGNOSIS — I951 Orthostatic hypotension: Secondary | ICD-10-CM | POA: Diagnosis not present

## 2014-07-20 DIAGNOSIS — O99613 Diseases of the digestive system complicating pregnancy, third trimester: Secondary | ICD-10-CM | POA: Diagnosis present

## 2014-07-20 DIAGNOSIS — Z87891 Personal history of nicotine dependence: Secondary | ICD-10-CM | POA: Diagnosis not present

## 2014-07-20 DIAGNOSIS — O9902 Anemia complicating childbirth: Secondary | ICD-10-CM | POA: Diagnosis present

## 2014-07-20 DIAGNOSIS — Z3A39 39 weeks gestation of pregnancy: Secondary | ICD-10-CM | POA: Diagnosis present

## 2014-07-20 DIAGNOSIS — O9832 Other infections with a predominantly sexual mode of transmission complicating childbirth: Principal | ICD-10-CM | POA: Diagnosis present

## 2014-07-20 DIAGNOSIS — O99824 Streptococcus B carrier state complicating childbirth: Secondary | ICD-10-CM | POA: Diagnosis present

## 2014-07-20 DIAGNOSIS — A6000 Herpesviral infection of urogenital system, unspecified: Secondary | ICD-10-CM | POA: Diagnosis present

## 2014-07-20 HISTORY — PX: BILATERAL SALPINGECTOMY: SHX5743

## 2014-07-20 LAB — CBC
HCT: 39 % (ref 36.0–46.0)
Hemoglobin: 13.5 g/dL (ref 12.0–15.0)
MCH: 32.5 pg (ref 26.0–34.0)
MCHC: 34.6 g/dL (ref 30.0–36.0)
MCV: 94 fL (ref 78.0–100.0)
PLATELETS: 234 10*3/uL (ref 150–400)
RBC: 4.15 MIL/uL (ref 3.87–5.11)
RDW: 14.3 % (ref 11.5–15.5)
WBC: 10.6 10*3/uL — ABNORMAL HIGH (ref 4.0–10.5)

## 2014-07-20 LAB — RPR

## 2014-07-20 LAB — TYPE AND SCREEN
ABO/RH(D): O NEG
ANTIBODY SCREEN: NEGATIVE

## 2014-07-20 SURGERY — Surgical Case
Anesthesia: Spinal | Site: Abdomen

## 2014-07-20 MED ORDER — NALBUPHINE HCL 10 MG/ML IJ SOLN: 5.0000 mg | INTRAMUSCULAR | Status: DC | PRN

## 2014-07-20 MED ORDER — MEPERIDINE HCL 25 MG/ML IJ SOLN: 6.2500 mg | INTRAMUSCULAR | Status: DC | PRN

## 2014-07-20 MED ORDER — MENTHOL 3 MG MT LOZG: 1.0000 | LOZENGE | OROMUCOSAL | Status: DC | PRN

## 2014-07-20 MED ORDER — MEASLES, MUMPS & RUBELLA VAC ~~LOC~~ INJ
0.5000 mL | INJECTION | Freq: Once | SUBCUTANEOUS | Status: DC
  Filled 2014-07-20: qty 0.5

## 2014-07-20 MED ORDER — MORPHINE SULFATE (PF) 0.5 MG/ML IJ SOLN
INTRAMUSCULAR | Status: DC | PRN
  Administered 2014-07-20: .2 mg via INTRATHECAL

## 2014-07-20 MED ORDER — NALOXONE HCL 1 MG/ML IJ SOLN
1.0000 ug/kg/h | INTRAVENOUS | Status: DC | PRN
  Filled 2014-07-20: qty 2

## 2014-07-20 MED ORDER — BUPIVACAINE HCL (PF) 0.25 % IJ SOLN
INTRAMUSCULAR | Status: DC | PRN
  Administered 2014-07-20: 20 mL

## 2014-07-20 MED ORDER — KETOROLAC TROMETHAMINE 30 MG/ML IJ SOLN: 30.0000 mg | Freq: Four times a day (QID) | INTRAMUSCULAR | Status: DC | PRN

## 2014-07-20 MED ORDER — ONDANSETRON HCL 4 MG/2ML IJ SOLN: 4.0000 mg | INTRAMUSCULAR | Status: DC | PRN

## 2014-07-20 MED ORDER — ONDANSETRON HCL 4 MG PO TABS: 4.0000 mg | ORAL_TABLET | ORAL | Status: DC | PRN

## 2014-07-20 MED ORDER — SIMETHICONE 80 MG PO CHEW
80.0000 mg | CHEWABLE_TABLET | Freq: Three times a day (TID) | ORAL | Status: DC
  Administered 2014-07-21 – 2014-07-22 (×5): 80 mg via ORAL
  Filled 2014-07-20 (×5): qty 1

## 2014-07-20 MED ORDER — GENTAMICIN SULFATE 40 MG/ML IJ SOLN
INTRAVENOUS | Status: AC
  Administered 2014-07-20: 115 mL via INTRAVENOUS
  Filled 2014-07-20: qty 9

## 2014-07-20 MED ORDER — SIMETHICONE 80 MG PO CHEW: 80.0000 mg | CHEWABLE_TABLET | ORAL | Status: DC | PRN

## 2014-07-20 MED ORDER — KETOROLAC TROMETHAMINE 30 MG/ML IJ SOLN
INTRAMUSCULAR | Status: AC
  Administered 2014-07-20: 30 mg
  Filled 2014-07-20: qty 1

## 2014-07-20 MED ORDER — ONDANSETRON HCL 4 MG/2ML IJ SOLN
INTRAMUSCULAR | Status: AC
  Filled 2014-07-20: qty 2

## 2014-07-20 MED ORDER — KETOROLAC TROMETHAMINE 30 MG/ML IJ SOLN
30.0000 mg | Freq: Once | INTRAMUSCULAR | Status: AC
  Administered 2014-07-20: 30 mg via INTRAMUSCULAR

## 2014-07-20 MED ORDER — DIPHENHYDRAMINE HCL 50 MG/ML IJ SOLN: 12.5000 mg | INTRAMUSCULAR | Status: DC | PRN

## 2014-07-20 MED ORDER — FERROUS SULFATE 325 (65 FE) MG PO TABS
325.0000 mg | ORAL_TABLET | Freq: Two times a day (BID) | ORAL | Status: DC
  Administered 2014-07-21 – 2014-07-22 (×3): 325 mg via ORAL
  Filled 2014-07-20 (×3): qty 1

## 2014-07-20 MED ORDER — PROMETHAZINE HCL 25 MG/ML IJ SOLN: 6.2500 mg | INTRAMUSCULAR | Status: DC | PRN

## 2014-07-20 MED ORDER — PRENATAL MULTIVITAMIN CH
1.0000 | ORAL_TABLET | Freq: Every day | ORAL | Status: DC
  Administered 2014-07-21 – 2014-07-22 (×2): 1 via ORAL
  Filled 2014-07-20 (×2): qty 1

## 2014-07-20 MED ORDER — PHENYLEPHRINE 8 MG IN D5W 100 ML (0.08MG/ML) PREMIX OPTIME
INJECTION | INTRAVENOUS | Status: DC | PRN
  Administered 2014-07-20: 60 ug/min via INTRAVENOUS

## 2014-07-20 MED ORDER — OXYTOCIN 10 UNIT/ML IJ SOLN
INTRAMUSCULAR | Status: AC
  Filled 2014-07-20: qty 4

## 2014-07-20 MED ORDER — LACTATED RINGERS IV SOLN: INTRAVENOUS | Status: DC

## 2014-07-20 MED ORDER — DIPHENHYDRAMINE HCL 25 MG PO CAPS
25.0000 mg | ORAL_CAPSULE | ORAL | Status: DC | PRN
  Filled 2014-07-20: qty 1

## 2014-07-20 MED ORDER — FENTANYL CITRATE 0.05 MG/ML IJ SOLN
INTRAMUSCULAR | Status: AC
  Filled 2014-07-20: qty 2

## 2014-07-20 MED ORDER — KETOROLAC TROMETHAMINE 30 MG/ML IJ SOLN: 15.0000 mg | Freq: Once | INTRAMUSCULAR | Status: DC | PRN

## 2014-07-20 MED ORDER — NALBUPHINE HCL 10 MG/ML IJ SOLN: 5.0000 mg | Freq: Once | INTRAMUSCULAR | Status: AC | PRN

## 2014-07-20 MED ORDER — SODIUM CHLORIDE 0.9 % IJ SOLN
3.0000 mL | INTRAMUSCULAR | Status: DC | PRN
Start: 2014-07-20 — End: 2014-07-22

## 2014-07-20 MED ORDER — LACTATED RINGERS IV SOLN
INTRAVENOUS | Status: DC
  Administered 2014-07-20 – 2014-07-21 (×2): via INTRAVENOUS
  Administered 2014-07-21: 999 mL via INTRAVENOUS

## 2014-07-20 MED ORDER — TETANUS-DIPHTH-ACELL PERTUSSIS 5-2.5-18.5 LF-MCG/0.5 IM SUSP: 0.5000 mL | Freq: Once | INTRAMUSCULAR | Status: DC

## 2014-07-20 MED ORDER — 0.9 % SODIUM CHLORIDE (POUR BTL) OPTIME
TOPICAL | Status: DC | PRN
  Administered 2014-07-20 (×2): 1000 mL

## 2014-07-20 MED ORDER — PHENYLEPHRINE HCL 10 MG/ML IJ SOLN
INTRAMUSCULAR | Status: AC
  Filled 2014-07-20: qty 1

## 2014-07-20 MED ORDER — SENNOSIDES-DOCUSATE SODIUM 8.6-50 MG PO TABS
2.0000 | ORAL_TABLET | ORAL | Status: DC
  Administered 2014-07-21 – 2014-07-22 (×2): 2 via ORAL
  Filled 2014-07-20 (×2): qty 2

## 2014-07-20 MED ORDER — METHYLERGONOVINE MALEATE 0.2 MG/ML IJ SOLN: 0.2000 mg | INTRAMUSCULAR | Status: DC | PRN

## 2014-07-20 MED ORDER — DIPHENHYDRAMINE HCL 25 MG PO CAPS: 25.0000 mg | ORAL_CAPSULE | Freq: Four times a day (QID) | ORAL | Status: DC | PRN

## 2014-07-20 MED ORDER — BUPIVACAINE IN DEXTROSE 0.75-8.25 % IT SOLN
INTRATHECAL | Status: DC | PRN
  Administered 2014-07-20: 1.4 mL via INTRATHECAL

## 2014-07-20 MED ORDER — MORPHINE SULFATE 0.5 MG/ML IJ SOLN
INTRAMUSCULAR | Status: AC
  Filled 2014-07-20: qty 10

## 2014-07-20 MED ORDER — HYDROMORPHONE HCL 1 MG/ML IJ SOLN: 0.2500 mg | INTRAMUSCULAR | Status: DC | PRN

## 2014-07-20 MED ORDER — ONDANSETRON HCL 4 MG/2ML IJ SOLN
INTRAMUSCULAR | Status: DC | PRN
  Administered 2014-07-20: 4 mg via INTRAVENOUS

## 2014-07-20 MED ORDER — LACTATED RINGERS IV SOLN
INTRAVENOUS | Status: DC | PRN
  Administered 2014-07-20: 14:00:00 via INTRAVENOUS

## 2014-07-20 MED ORDER — LANOLIN HYDROUS EX OINT: 1.0000 "application " | TOPICAL_OINTMENT | CUTANEOUS | Status: DC | PRN

## 2014-07-20 MED ORDER — DIBUCAINE 1 % RE OINT: 1.0000 "application " | TOPICAL_OINTMENT | RECTAL | Status: DC | PRN

## 2014-07-20 MED ORDER — SCOPOLAMINE 1 MG/3DAYS TD PT72
MEDICATED_PATCH | TRANSDERMAL | Status: AC
  Administered 2014-07-20: 1.5 mg via TRANSDERMAL
  Filled 2014-07-20: qty 1

## 2014-07-20 MED ORDER — NALOXONE HCL 0.4 MG/ML IJ SOLN: 0.4000 mg | INTRAMUSCULAR | Status: DC | PRN

## 2014-07-20 MED ORDER — OXYTOCIN 40 UNITS IN LACTATED RINGERS INFUSION - SIMPLE MED: 62.5000 mL/h | INTRAVENOUS | Status: AC

## 2014-07-20 MED ORDER — OXYCODONE-ACETAMINOPHEN 5-325 MG PO TABS: 2.0000 | ORAL_TABLET | ORAL | Status: DC | PRN

## 2014-07-20 MED ORDER — ZOLPIDEM TARTRATE 5 MG PO TABS: 5.0000 mg | ORAL_TABLET | Freq: Every evening | ORAL | Status: DC | PRN

## 2014-07-20 MED ORDER — OXYCODONE-ACETAMINOPHEN 5-325 MG PO TABS: 1.0000 | ORAL_TABLET | ORAL | Status: DC | PRN

## 2014-07-20 MED ORDER — SCOPOLAMINE 1 MG/3DAYS TD PT72
1.0000 | MEDICATED_PATCH | Freq: Once | TRANSDERMAL | Status: DC
  Administered 2014-07-20: 1.5 mg via TRANSDERMAL

## 2014-07-20 MED ORDER — SIMETHICONE 80 MG PO CHEW
80.0000 mg | CHEWABLE_TABLET | ORAL | Status: DC
  Administered 2014-07-21: 80 mg via ORAL
  Filled 2014-07-20 (×2): qty 1

## 2014-07-20 MED ORDER — KETOROLAC TROMETHAMINE 30 MG/ML IJ SOLN
30.0000 mg | Freq: Four times a day (QID) | INTRAMUSCULAR | Status: AC | PRN
  Filled 2014-07-20: qty 1

## 2014-07-20 MED ORDER — ONDANSETRON HCL 4 MG/2ML IJ SOLN: 4.0000 mg | Freq: Three times a day (TID) | INTRAMUSCULAR | Status: DC | PRN

## 2014-07-20 MED ORDER — IBUPROFEN 600 MG PO TABS: 600.0000 mg | ORAL_TABLET | Freq: Four times a day (QID) | ORAL | Status: DC | PRN

## 2014-07-20 MED ORDER — IBUPROFEN 600 MG PO TABS
600.0000 mg | ORAL_TABLET | Freq: Four times a day (QID) | ORAL | Status: DC
  Administered 2014-07-21 – 2014-07-22 (×6): 600 mg via ORAL
  Filled 2014-07-20 (×6): qty 1

## 2014-07-20 MED ORDER — LACTATED RINGERS IV BOLUS (SEPSIS): 500.0000 mL | Freq: Once | INTRAVENOUS | Status: DC

## 2014-07-20 MED ORDER — WITCH HAZEL-GLYCERIN EX PADS: 1.0000 "application " | MEDICATED_PAD | CUTANEOUS | Status: DC | PRN

## 2014-07-20 MED ORDER — LACTATED RINGERS IV SOLN
INTRAVENOUS | Status: DC
  Administered 2014-07-20 (×3): via INTRAVENOUS

## 2014-07-20 MED ORDER — BUPIVACAINE HCL (PF) 0.25 % IJ SOLN
INTRAMUSCULAR | Status: AC
  Filled 2014-07-20: qty 30

## 2014-07-20 MED ORDER — METHYLERGONOVINE MALEATE 0.2 MG PO TABS: 0.2000 mg | ORAL_TABLET | ORAL | Status: DC | PRN

## 2014-07-20 MED ORDER — FENTANYL CITRATE 0.05 MG/ML IJ SOLN
INTRAMUSCULAR | Status: DC | PRN
  Administered 2014-07-20: 12.5 ug via INTRATHECAL

## 2014-07-20 MED ORDER — OXYTOCIN 40 UNITS IN LACTATED RINGERS INFUSION - SIMPLE MED
INTRAVENOUS | Status: DC | PRN
  Administered 2014-07-20: 40 [IU] via INTRAVENOUS

## 2014-07-20 SURGICAL SUPPLY — 35 items
APL SKNCLS STERI-STRIP NONHPOA (GAUZE/BANDAGES/DRESSINGS) ×2
BENZOIN TINCTURE PRP APPL 2/3 (GAUZE/BANDAGES/DRESSINGS) ×4 IMPLANT
BOOTIES KNEE HIGH SLOAN (MISCELLANEOUS) ×8 IMPLANT
CLAMP CORD UMBIL (MISCELLANEOUS) ×2 IMPLANT
CLOSURE WOUND 1/2 X4 (GAUZE/BANDAGES/DRESSINGS) ×1
CLOTH BEACON ORANGE TIMEOUT ST (SAFETY) ×4 IMPLANT
DRAPE SHEET LG 3/4 BI-LAMINATE (DRAPES) ×2 IMPLANT
DRSG OPSITE POSTOP 4X10 (GAUZE/BANDAGES/DRESSINGS) ×4 IMPLANT
DURAPREP 26ML APPLICATOR (WOUND CARE) ×4 IMPLANT
ELECT REM PT RETURN 9FT ADLT (ELECTROSURGICAL) ×4
ELECTRODE REM PT RTRN 9FT ADLT (ELECTROSURGICAL) ×2 IMPLANT
GLOVE BIOGEL PI IND STRL 7.0 (GLOVE) ×2 IMPLANT
GLOVE BIOGEL PI INDICATOR 7.0 (GLOVE) ×2
GLOVE ECLIPSE 6.5 STRL STRAW (GLOVE) ×4 IMPLANT
GOWN STRL REUS W/TWL LRG LVL3 (GOWN DISPOSABLE) ×8 IMPLANT
NEEDLE HYPO 22GX1.5 SAFETY (NEEDLE) ×4 IMPLANT
NS IRRIG 1000ML POUR BTL (IV SOLUTION) ×8 IMPLANT
PACK C SECTION WH (CUSTOM PROCEDURE TRAY) ×4 IMPLANT
PAD ABD 7.5X8 STRL (GAUZE/BANDAGES/DRESSINGS) ×2 IMPLANT
PAD OB MATERNITY 4.3X12.25 (PERSONAL CARE ITEMS) ×4 IMPLANT
RTRCTR C-SECT PINK 25CM LRG (MISCELLANEOUS) ×4 IMPLANT
SPONGE GAUZE 4X4 12PLY STER LF (GAUZE/BANDAGES/DRESSINGS) ×2 IMPLANT
SPONGE LAP 18X18 X RAY DECT (DISPOSABLE) ×4 IMPLANT
STRIP CLOSURE SKIN 1/2X4 (GAUZE/BANDAGES/DRESSINGS) ×3 IMPLANT
SUT MNCRL AB 3-0 PS2 27 (SUTURE) ×4 IMPLANT
SUT VIC AB 0 CTX 36 (SUTURE) ×8
SUT VIC AB 0 CTX36XBRD ANBCTRL (SUTURE) ×4 IMPLANT
SUT VIC AB 1 CT1 36 (SUTURE) ×8 IMPLANT
SUT VIC AB 2-0 CT1 27 (SUTURE) ×8
SUT VIC AB 2-0 CT1 TAPERPNT 27 (SUTURE) IMPLANT
SYR 20CC LL (SYRINGE) ×4 IMPLANT
TAPE CLOTH SURG 4X10 WHT LF (GAUZE/BANDAGES/DRESSINGS) ×2 IMPLANT
TOWEL OR 17X24 6PK STRL BLUE (TOWEL DISPOSABLE) ×4 IMPLANT
TRAY FOLEY CATH 14FR (SET/KITS/TRAYS/PACK) ×4 IMPLANT
WATER STERILE IRR 1000ML POUR (IV SOLUTION) ×4 IMPLANT

## 2014-07-20 NOTE — Op Note (Signed)
Preoperative diagnosis: Intrauterine pregnancy at 39 weeks and 4 days with history of HSV and symptomatic rectocele requesting cesarean delivery, desire for sterilization  Post operative diagnosis: Same  Anesthesia: Spinal  Anesthesiologist: Dr. Arby BarretteHatchett  Procedure: Primary low transverse cesarean section with bilateral salpyngectomy  Surgeon: Dr. Dois DavenportSandra Lynnel Zanetti  Assistant: Venus Standard CNM  Estimated blood loss: 700 cc  Procedure:  After being informed of the planned procedure and possible complications including bleeding, infection, injury to other organs, informed consent is obtained. The patient is taken to OR #1 and given spinal anesthesia without complication. She is placed in the dorsal decubitus position with the pelvis tilted to the left. She is then prepped and draped in a sterile fashion. A Foley catheter is inserted in her bladder.  After assessing adequate level of anesthesia, we infiltrate the suprapubic area with 20 cc of Marcaine 0.25 and perform a Pfannenstiel incision which is brought down sharply to the fascia. The fascia is entered in a low transverse fashion. Linea alba is dissected. Peritoneum is entered in a midline fashion. An Alexis retractor is easily positioned.   The myometrium is then entered in a low transverse fashion, 2 cm above the vesico-uterine junction ; first with knife and then extended bluntly. Amniotic fluid is clear. We assist the birth of a female  infant in vertex presentation. Mouth and nose are suctioned. The baby is delivered. The nuchal cord is reduced, clamped and sectioned. The baby is given to the neonatologist present in the room.  The placenta is allowed to deliver spontaneously. It is complete and the cord has 3 vessels. Uterine revision is negative.  We proceed with closure of the myometrium in 2 layers: First with a running locked suture of 0 Vicryl, then with a Lembert suture of 0 Vicryl imbricating the first one. Hemostasis is  completed with cauterization on peritoneal edges.  Both paracolic gutters are cleaned. Both tubes and ovaries are assessed and normal. The pelvis is profusely irrigated with warm saline to confirm a satisfactory hemostasis.  We then proceed with bilateral salpyngectomy by isolating each tube on 2 Rogers forceps, sectioning and removing the whole length of the tube. The pedicles are then sutured with 2 transfixed sutures of 2-0 Vicryl. Hemostasis is completed with figure-of-eight stitches of 2-0 Vicryl.  Retractors and sponges are removed. Under fascia hemostasis is completed with cauterization. The fascia is then closed with 2 running sutures of 0 Vicryl meeting midline. The wound is irrigated with warm saline and hemostasis is completed with cauterization. The skin is closed with a subcuticular suture of 3-0 Monocryl and Steri-Strips.  Instrument and sponge count is complete x2. Estimated blood loss is 700 cc.  The procedure is well tolerated by the patient who is taken to recovery room in a well and stable condition.  female baby named Mitzie Naverett was born at 13:42 and received an Apgar of 8  at 1 minute and 9 at 5 minutes.    Specimen: Placenta sent to L & D, tubes to pathology.   Darenda Fike A MD 10/20/20152:41 PM

## 2014-07-20 NOTE — Interval H&P Note (Signed)
History and Physical Interval Note:  07/20/2014 12:58 PM  [name]  has presented today for surgery, with the diagnosis of HSV 2, Elective  C/S  The various methods of treatment have been discussed with the patient and family. After consideration of risks, benefits and other options for treatment, the patient has consented to  Procedure(s): CESAREAN SECTION (N/A) as a surgical intervention . Patient also requesting sterilization: procedure of bilateral salpyngectomy for ovarian cancer reduction, R&B reviewed including irreversibility. The patient's history has been reviewed, patient examined, no change in status, stable for surgery.  I have reviewed the patient's chart and labs.  Questions were answered to the patient's satisfaction.     Laronda Lisby A

## 2014-07-20 NOTE — Plan of Care (Signed)
Problem: Phase I Progression Outcomes Goal: OOB as tolerated unless otherwise ordered Outcome: Completed/Met Date Met:  07/20/14 Postural BP low, Nurse midwife notified, and orders for fluid bolus. Unable to ambulate in room, but tolerated standing at bedside well.

## 2014-07-20 NOTE — Anesthesia Postprocedure Evaluation (Signed)
Anesthesia Post Note  Patient: [name]  Procedure(s) Performed: Procedure(s) (LRB): CESAREAN SECTION (N/A) BILATERAL SALPINGECTOMY (Bilateral)  Anesthesia type: Spinal  Patient location: PACU  Post pain: Pain level controlled  Post assessment: Post-op Vital signs reviewed  Last Vitals:  Filed Vitals:   07/20/14 1500  BP: 102/61  Pulse: 76  Temp:   Resp: 20    Post vital signs: Reviewed  Level of consciousness: awake  Complications: No apparent anesthesia complications

## 2014-07-20 NOTE — Progress Notes (Addendum)
S: Received call from RN re: pt with low BPs. Orthostatics completed. Per RN, pt has been refusing to get OOB since her surgery. States pt called out about an hr ago asking to get up. Pt denies dizziness or lightheadedness.  O: BP range 84-96/39-62, maternal pulse 68-100, afebrile. Abdomen: soft, appropriately tender to palpation, dressing C/D/I UOP 300 ml  A: S/P primary c-section; day 0  Orthostatic hypotension  P: 500 ml LR bolus CBC CBG Hold off on ambulating at this time  Sherre ScarletKimberly Jarren Para, [place] 07/20/14 @ 11:55 PM

## 2014-07-20 NOTE — Lactation Note (Signed)
This note was copied from the chart of [name]. Lactation Consultation Note  Patient Name: Carolyn Smitty KnudsenCarli Bicking ZOXWR'UToday's Date: 07/20/2014 Reason for consult: Initial assessment of this experienced multipara and her newborn, just 6 hours postpartum. She reports breastfeeding her older children for 4 and 6 months respectively.  [facility], mom is not feeling well and family member at bedside.  LC briefly reviewed [facility] Resource material and encouraged STS and cue feedings. LC encouraged review of Baby and Me pp 9, 14 and 20-25 for STS and BF information. LC provided [facility] Resource brochure and reviewed [facility] services and list of community and web site resources.     Maternal Data Formula Feeding for Exclusion: No Has patient been taught Hand Expression?:  (experienced mom) Does the patient have breastfeeding experience prior to this delivery?: Yes  Feeding    LATCH Score/Interventions           initial LATCH score=8 per RN assessment           Lactation Tools Discussed/Used   STS and cue feedings (additional instructions needed for review when mom feels better)  Consult Status Consult Status: Follow-up Date: 07/21/14 Follow-up type: In-patient    Warrick ParisianBryant, Amillya Chavira [facility] 07/20/2014, 8:39 PM

## 2014-07-20 NOTE — Transfer of Care (Signed)
Immediate Anesthesia Transfer of Care Note  Patient: [name]  Procedure(s) Performed: Procedure(s): CESAREAN SECTION (N/A)  Patient Location: PACU  Anesthesia Type:Spinal  Level of Consciousness: awake, alert , oriented and patient cooperative  Airway & Oxygen Therapy: Patient Spontanous Breathing  Post-op Assessment: Report given to PACU RN and Post -op Vital signs reviewed and stable  Post vital signs: Reviewed and stable  Complications: No apparent anesthesia complications

## 2014-07-20 NOTE — Anesthesia Preprocedure Evaluation (Signed)
Anesthesia Evaluation  Patient identified by MRN, date of birth, ID band Patient awake    Reviewed: Allergy & Precautions, H&P , NPO status , Patient's Chart, lab work & pertinent test results  Airway Mallampati: II TM Distance: >3 FB Neck ROM: Full    Dental no notable dental hx. (+) Teeth Intact   Pulmonary former smoker,  breath sounds clear to auscultation  Pulmonary exam normal       Cardiovascular negative cardio ROS  Rhythm:Regular Rate:Normal     Neuro/Psych negative neurological ROS  negative psych ROS   GI/Hepatic Neg liver ROS, GERD-  ,  Endo/Other  negative endocrine ROS  Renal/GU negative Renal ROS  negative genitourinary   Musculoskeletal negative musculoskeletal ROS (+)   Abdominal (+) - obese,   Peds  Hematology negative hematology ROS (+)   Anesthesia Other Findings   Reproductive/Obstetrics (+) Pregnancy HSV                           Anesthesia Physical Anesthesia Plan  ASA: II  Anesthesia Plan: Spinal   Post-op Pain Management:    Induction:   Airway Management Planned: Natural Airway  Additional Equipment:   Intra-op Plan:   Post-operative Plan:   Informed Consent: I have reviewed the patients History and Physical, chart, labs and discussed the procedure including the risks, benefits and alternatives for the proposed anesthesia with the patient or authorized representative who has indicated his/her understanding and acceptance.     Plan Discussed with: Anesthesiologist, CRNA and Surgeon  Anesthesia Plan Comments:         Anesthesia Quick Evaluation

## 2014-07-21 ENCOUNTER — Encounter (HOSPITAL_COMMUNITY): Payer: Self-pay | Admitting: Obstetrics and Gynecology

## 2014-07-21 LAB — CBC
HCT: 27.3 % — ABNORMAL LOW (ref 36.0–46.0)
HCT: 27.7 % — ABNORMAL LOW (ref 36.0–46.0)
Hemoglobin: 9.5 g/dL — ABNORMAL LOW (ref 12.0–15.0)
Hemoglobin: 9.7 g/dL — ABNORMAL LOW (ref 12.0–15.0)
MCH: 32.8 pg (ref 26.0–34.0)
MCH: 33 pg (ref 26.0–34.0)
MCHC: 34.8 g/dL (ref 30.0–36.0)
MCHC: 35 g/dL (ref 30.0–36.0)
MCV: 94.1 fL (ref 78.0–100.0)
MCV: 94.2 fL (ref 78.0–100.0)
PLATELETS: 165 10*3/uL (ref 150–400)
PLATELETS: 173 10*3/uL (ref 150–400)
RBC: 2.9 MIL/uL — AB (ref 3.87–5.11)
RBC: 2.94 MIL/uL — AB (ref 3.87–5.11)
RDW: 14.3 % (ref 11.5–15.5)
RDW: 14.5 % (ref 11.5–15.5)
WBC: 10.7 10*3/uL — AB (ref 4.0–10.5)
WBC: 11.1 10*3/uL — AB (ref 4.0–10.5)

## 2014-07-21 LAB — BIRTH TISSUE RECOVERY COLLECTION (PLACENTA DONATION)

## 2014-07-21 LAB — GLUCOSE, CAPILLARY: Glucose-Capillary: 83 mg/dL (ref 70–99)

## 2014-07-21 MED ORDER — RHO D IMMUNE GLOBULIN 1500 UNIT/2ML IJ SOSY
300.0000 ug | PREFILLED_SYRINGE | Freq: Once | INTRAMUSCULAR | Status: AC
  Administered 2014-07-21: 300 ug via INTRAVENOUS
  Filled 2014-07-21: qty 2

## 2014-07-21 MED ORDER — ACETAMINOPHEN 325 MG PO TABS
650.0000 mg | ORAL_TABLET | ORAL | Status: DC | PRN
  Administered 2014-07-21 (×2): 650 mg via ORAL
  Filled 2014-07-21 (×2): qty 2

## 2014-07-21 MED ORDER — LACTATED RINGERS IV BOLUS (SEPSIS): 500.0000 mL | Freq: Once | INTRAVENOUS | Status: DC

## 2014-07-21 NOTE — Addendum Note (Signed)
Addendum created 07/21/14 0914 by Yolonda KidaAlison L Petros Ahart, CRNA   Modules edited: Notes Section   Notes Section:  File: [id number]

## 2014-07-21 NOTE — Progress Notes (Signed)
Spoke with Venus Standard CNM about plan of care for pt. CNM states to keep pt on bedrest and to keep foley in for now. Pt to eat breakfast this AM. Will continue to monitor pts VS and condition closely.

## 2014-07-21 NOTE — Progress Notes (Signed)
Midwife notified of pts blood pressure reading of 103/58. Notified that patient ambulated without dizziness. Output was sufficient. Order received to discontinue IV fluids & foley catheter

## 2014-07-21 NOTE — Progress Notes (Addendum)
Subjective: Postpartum Day 1: Elective Cesarean Delivery Patient reports no syncope or dizziness. Feeding:  breast Contraceptive plan:  unsure  Objective: Vital signs in last 24 hours: Temp:  [97.4 F (36.3 C)-98.8 F (37.1 C)] 98 F (36.7 C) (10/21 0845) Pulse Rate:  [57-100] 67 (10/21 0845) Resp:  [13-20] 20 (10/21 0845) BP: (82-112)/(39-68) 82/43 mmHg (10/21 0845) SpO2:  [97 %-100 %] 98 % (10/21 0845) Weight:  [74.844 kg (165 lb)] 74.844 kg (165 lb) (10/20 1100)   Filed Vitals:   07/21/14 0422 07/21/14 0445 07/21/14 0645 07/21/14 0845  BP: 82/43 86/44 89/45  82/43  Pulse:   68 67  Temp:   97.8 F (36.6 C) 98 F (36.7 C)  TempSrc:   Oral Axillary  Resp:   20 20  Height:      Weight:      SpO2:   97% 98%   Results for orders placed during the hospital encounter of 07/20/14 (from the past 24 hour(s))  CBC     Status: Abnormal   Collection Time    07/20/14 11:55 PM      Result Value Ref Range   WBC 10.7 (*) 4.0 - 10.5 K/uL   RBC 2.90 (*) 3.87 - 5.11 MIL/uL   Hemoglobin 9.5 (*) 12.0 - 15.0 g/dL   HCT 04.527.3 (*) 40.936.0 - 81.146.0 %   MCV 94.1  78.0 - 100.0 fL   MCH 32.8  26.0 - 34.0 pg   MCHC 34.8  30.0 - 36.0 g/dL   RDW 91.414.3  78.211.5 - 95.615.5 %   Platelets 173  150 - 400 K/uL  GLUCOSE, CAPILLARY     Status: None   Collection Time    07/21/14  4:22 AM      Result Value Ref Range   Glucose-Capillary 83  70 - 99 mg/dL  CBC     Status: Abnormal   Collection Time    07/21/14  6:30 AM      Result Value Ref Range   WBC 11.1 (*) 4.0 - 10.5 K/uL   RBC 2.94 (*) 3.87 - 5.11 MIL/uL   Hemoglobin 9.7 (*) 12.0 - 15.0 g/dL   HCT 21.327.7 (*) 08.636.0 - 57.846.0 %   MCV 94.2  78.0 - 100.0 fL   MCH 33.0  26.0 - 34.0 pg   MCHC 35.0  30.0 - 36.0 g/dL   RDW 46.914.5  62.911.5 - 52.815.5 %   Platelets 165  150 - 400 K/uL  BIRTH TISSUE RECOVERY COLLECTION (PLACENTA DONATION)     Status: None   Collection Time    07/21/14  6:30 AM      Result Value Ref Range   Placenta donation bld collect COLLECTED BY LABORATORY       Physical Exam:  General: alert and cooperative, pale Lochia: appropriate Uterine Fundus: firm Abdomen:  + bowel sounds, non distended Incision: healing well  Honeycomb dressing CDI DVT Evaluation: No evidence of DVT seen on physical exam. Homan's sign: Negative   Recent Labs  07/20/14 1015 07/20/14 2355 07/21/14 0630  HGB 13.5 9.5* 9.7*  HCT 39.0 27.3* 27.7*  WBC 10.6* 10.7* 11.1*     Assessment: Status post Cesarean section day 1. Postoperative course complicated by a hypotensive episode  Honeycomb dressing in place, no significant drainage Anemia - hemodynamicly stable.    Plan: Continue current care. Breastfeeding and Lactation consult Dr. Aris Georgiavrivard updated on patient status Regular diet   [name], CNM, MSN 07/21/2014. 8:57 AM

## 2014-07-21 NOTE — Anesthesia Postprocedure Evaluation (Signed)
  Anesthesia Post-op Note  Patient: [name] K Hallstrom  Procedure(s) Performed: Procedure(s): CESAREAN SECTION (N/A) BILATERAL SALPINGECTOMY (Bilateral)  Patient Location: Mother/Baby  Anesthesia Type:Spinal  Level of Consciousness: awake, alert , oriented and patient cooperative  Airway and Oxygen Therapy: Patient Spontanous Breathing  Post-op Pain: mild  Post-op Assessment: Post-op Vital signs reviewed, Patient's Cardiovascular Status Stable, Respiratory Function Stable, Patent Airway, No headache, No backache, No residual numbness and No residual motor weakness  Post-op Vital Signs: Reviewed and stable--Pt states that she has been hypotensive, but is stable and alert with intact neuro assessment.  Pt has not been out of bed due to low BP, but has strong bilateral LE assessment with no residual numbness or weakness.  Last Vitals:  Filed Vitals:   07/21/14 0845  BP: 82/43  Pulse: 67  Temp: 36.7 C  Resp: 20    Complications: No apparent anesthesia complications

## 2014-07-21 NOTE — Progress Notes (Addendum)
S: In to see pt. Per RN, pt still not OOB due to consistently low BPs. Pt with c/o dizziness when stood up beside bed early yesterday evening. States tired. Tolerating both po food/fluids. Reports no flatus. O: Gen: NAD Pale color BP 83-89/42-50; pulse 57-63, SPO2 99% on RA. BP 86/44 manually Lungs: CTAB CV: RRR w/o murmur Abdomen: soft, slightly distended, diminished bowel sounds in 3 quadrants, active in RUQ; appropriately tender to palpation, no guarding or rebound tenderness; dressing c/d/i  I/O last 3 completed shifts: In: 4300 [P.O.:1200; I.V.:3100] Out: 4175 [Urine:3375; Blood:800]      Results for orders placed during the hospital encounter of 07/20/14 (from the past 24 hour(s))  TYPE AND SCREEN     Status: None   Collection Time    07/20/14 10:15 AM      Result Value Ref Range   ABO/RH(D) O NEG     Antibody Screen NEG     Sample Expiration 07/23/2014    CBC     Status: Abnormal   Collection Time    07/20/14 10:15 AM      Result Value Ref Range   WBC 10.6 (*) 4.0 - 10.5 K/uL   RBC 4.15  3.87 - 5.11 MIL/uL   Hemoglobin 13.5  12.0 - 15.0 g/dL   HCT 40.939.0  81.136.0 - 91.446.0 %   MCV 94.0  78.0 - 100.0 fL   MCH 32.5  26.0 - 34.0 pg   MCHC 34.6  30.0 - 36.0 g/dL   RDW 78.214.3  95.611.5 - 21.315.5 %   Platelets 234  150 - 400 K/uL  RPR     Status: None   Collection Time    07/20/14 10:15 AM      Result Value Ref Range   RPR NON REAC  NON REAC  CBC     Status: Abnormal   Collection Time    07/20/14 11:55 PM      Result Value Ref Range   WBC 10.7 (*) 4.0 - 10.5 K/uL   RBC 2.90 (*) 3.87 - 5.11 MIL/uL   Hemoglobin 9.5 (*) 12.0 - 15.0 g/dL   HCT 08.627.3 (*) 57.836.0 - 46.946.0 %   MCV 94.1  78.0 - 100.0 fL   MCH 32.8  26.0 - 34.0 pg   MCHC 34.8  30.0 - 36.0 g/dL   RDW 62.914.3  52.811.5 - 41.315.5 %   Platelets 173  150 - 400 K/uL  GLUCOSE, CAPILLARY     Status: None   Collection Time    07/21/14  4:22 AM      Result Value Ref Range   Glucose-Capillary 83  70 - 99 mg/dL   A: Orthostatic  hypotension Normal CBG Anemia  P: Re-evaluate for possible blood transfusion Will give another 500 ml LR bolus now Pt needs to be OOB as soon as her condition allows Dr. Normand Sloopillard notified. CBC as originally ordered. Pt to stay in bed until CBC resulted per Dr. Normand Sloopillard.  Sherre ScarletKimberly Lyda Colcord, CNM 07/21/14, 4:18 AM

## 2014-07-21 NOTE — Plan of Care (Signed)
Problem: Phase I Progression Outcomes Goal: Voiding adequately Outcome: Not Met (add Reason) Foley catheter in place, pt is on bedrest per midwife instruction. Goal: VS, stable, temp < 100.4 degrees F Outcome: Progressing BP's are low, MD and midwife is aware. Will continue to monitor.  Problem: Phase II Progression Outcomes Goal: Pain controlled on oral analgesia Outcome: Completed/Met Date Met:  07/21/14 Pain well controlled with scheduled motrin at this time.  Goal: Tolerating diet Outcome: Completed/Met Date Met:  07/21/14 Pt is tolerating a regular diet well.

## 2014-07-22 LAB — RH IG WORKUP (INCLUDES ABO/RH)
ABO/RH(D): O NEG
FETAL SCREEN: NEGATIVE
GESTATIONAL AGE(WKS): 39.5
Unit division: 0

## 2014-07-22 MED ORDER — IBUPROFEN 600 MG PO TABS: 600.0000 mg | ORAL_TABLET | Freq: Four times a day (QID) | ORAL | Status: AC

## 2014-07-22 NOTE — Discharge Instructions (Signed)
Cesarean Delivery  Cesarean delivery is the birth of a baby through a cut (incision) in the abdomen and womb (uterus).  LET YOUR HEALTH CARE PROVIDER KNOW ABOUT:  All medicines you are taking, including vitamins, herbs, eye drops, creams, and over-the-counter medicines.  Previous problems you or members of your family have had with the use of anesthetics.  Any blood disorders you have.  Previous surgeries you have had.  Medical conditions you have.  Any allergies you have.  Complicationsinvolving the pregnancy. RISKS AND COMPLICATIONS  Generally, this is a safe procedure. However, as with any procedure, complications can occur. Possible complications include:  Bleeding.  Infection.  Blood clots.  Injury to surrounding organs.  Problems with anesthesia.  Injury to the baby. BEFORE THE PROCEDURE   You may be given an antacid medicine to drink. This will prevent acid contents in your stomach from going into your lungs if you vomit during the surgery.  You may be given an antibiotic medicine to prevent infection. PROCEDURE   Hair may be removed from your pubic area and your lower abdomen. This is to prevent infection in the incision site.  A tube (Foley catheter) will be placed in your bladder to drain your urine from your bladder into a bag. This keeps your bladder empty during surgery.  An IV tube will be placed in your vein.  You may be given medicine to numb the lower half of your body (regional anesthetic). If you were in labor, you may have already had an epidural in place which can be used in both labor and cesarean delivery. You may possibly be given medicine to make you sleep (general anesthetic) though this is not as common.  An incision will be made in your abdomen that extends to your uterus. There are 2 basic kinds of incisions:  The horizontal (transverse) incision. Horizontal incisions are from side to side and are used for most routine cesarean  deliveries.  The vertical incision. The vertical incision is from the top of the abdomen to the bottom and is less commonly used. It is often done for women who have a serious complication (extreme prematurity) or under emergency situations.  The horizontal and vertical incisions may both be used at the same time. However, this is very uncommon.  An incision is then made in your uterus to deliver the baby.  Your baby will then be delivered.  Both incisions are then closed with absorbable stitches. AFTER THE PROCEDURE   If you were awake during the surgery, you will see your baby right away. If you were asleep, you will see your baby as soon as you are awake.  You may breastfeed your baby after surgery.  You may be able to get up and walk the same day as the surgery. If you need to stay in bed for a period of time, you will receive help to turn, cough, and take deep breaths after surgery. This helps prevent lung problems such as pneumonia.  Do not get out of bed alone the first time after surgery. You will need help getting out of bed until you are able to do this by yourself.  You may be able to shower the day after your cesarean delivery. After the bandage (dressing) is taken off the incision site, a nurse will assist you to shower if you would like help.  You will have pneumatic compression hose placed on your lower legs. This is done to prevent blood clots. When you are up   and walking regularly, they will no longer be necessary.  Do not cross your legs when you sit.  Save any blood clots that you pass. If you pass a clot while on the toilet, do not flush it. Call for the nurse. Tell the nurse if you think you are bleeding too much or passing too many clots.  You will be given medicine as needed. Let your health care providers know if you are hurting. You may also be given an antibiotic to prevent an infection.  Your IV tube will be taken out when you are drinking a reasonable  amount of fluids. The Foley catheter is taken out when you are up and walking.  If your blood type is Rh negative and your baby's blood type is Rh positive, you will be given a shot of anti-D immune globulin. This shot prevents you from having Rh problems with a future pregnancy. You should get the shot even if you had your tubes tied (tubal ligation).  If you are allowed to take the baby for a walk, place the baby in the bassinet and push it. Do not carry your baby in your arms. Document Released: 09/17/2005 Document Revised: 07/08/2013 Document Reviewed: 04/08/2013 [employer] Patient Information 2015 [employer]. This information is not intended to replace advice given to you by your health care provider. Make sure you discuss any questions you have with your health care provider.  

## 2014-07-22 NOTE — Lactation Note (Signed)
This note was copied from the chart of [name]. Lactation Consultation Note Mom sleeping soundly X 2 times I tried to visit. Noted in I&O for baby, it has pooped 7 times and voided 6 times, which is adequate. It could contribute to wt. Loss. Had 8% wt. Loss. Wanted to question mom about frequency of pumping.  Patient Name: Carolyn Smitty KnudsenCarli Padilla ZOXWR'UToday's Date: 07/22/2014 Reason for consult: Infant weight loss   Maternal Data    Feeding    LATCH Score/Interventions                      Lactation Tools Discussed/Used     Consult Status Consult Status: Follow-up Date: 07/22/14 Follow-up type: In-patient    [name], Diamond NickelLAURA G 07/22/2014, 6:18 AM

## 2014-07-22 NOTE — Discharge Summary (Signed)
  Obstetric Discharge Summary  Reason for Admission: cesarean section on 07/20/14 for history of HSV ( confidential) and symptomatic rectocele Prenatal Procedures: none Intrapartum Procedures: cesarean: low cervical, transverse by Dr Estanislado Pandyivard on 07/22/14 Postpartum Procedures: none Complications-Operative and Postpartum: none  Hemoglobin  Date Value Ref Range Status  07/21/2014 9.7* 12.0 - 15.0 g/dL Final     HCT  Date Value Ref Range Status  07/21/2014 27.7* 36.0 - 46.0 % Final    Discharge Diagnoses: Term Pregnancy-delivered  Hospital course: uneventful  Date: 07/22/2014 Activity: unrestricted Diet: routine Medications: Ibuprofen and Colace Condition: stable  Breastfeeding:   Yes.   Contraception:  bilateral salpyngectomy during cesarean section  Instructions: refer to practice specific booklet Discharge to: home   Newborn Data:   Baby female Name: Jacqulyn Duckingverett  Boleslaus Holloway A MD 07/22/2014, 12:40 PM

## 2014-08-02 ENCOUNTER — Encounter (HOSPITAL_COMMUNITY): Payer: Self-pay | Admitting: Obstetrics and Gynecology
